# Patient Record
Sex: Male | Born: 1965 | Race: White | Hispanic: No | Smoking: Current some day smoker
Health system: Southern US, Community
[De-identification: ages and names within clinical notes are randomized; demographics above are authoritative.]

## PROBLEM LIST (undated history)

## (undated) DIAGNOSIS — M7550 Bursitis of unspecified shoulder: Secondary | ICD-10-CM

## (undated) DIAGNOSIS — M1990 Unspecified osteoarthritis, unspecified site: Secondary | ICD-10-CM

## (undated) DIAGNOSIS — K219 Gastro-esophageal reflux disease without esophagitis: Secondary | ICD-10-CM

## (undated) DIAGNOSIS — J302 Other seasonal allergic rhinitis: Secondary | ICD-10-CM

## (undated) HISTORY — DX: Gastro-esophageal reflux disease without esophagitis: K21.9

## (undated) HISTORY — PX: SMALL INTESTINE SURGERY: SHX150

## (undated) HISTORY — DX: Bursitis of unspecified shoulder: M75.50

## (undated) HISTORY — DX: Unspecified osteoarthritis, unspecified site: M19.90

## (undated) HISTORY — DX: Other seasonal allergic rhinitis: J30.2

---
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
## 2008-03-23 ENCOUNTER — Ambulatory Visit: Payer: Self-pay | Admitting: *Deleted

## 2008-03-23 DIAGNOSIS — J309 Allergic rhinitis, unspecified: Secondary | ICD-10-CM | POA: Insufficient documentation

## 2008-03-23 DIAGNOSIS — K219 Gastro-esophageal reflux disease without esophagitis: Secondary | ICD-10-CM

## 2008-03-23 DIAGNOSIS — M25519 Pain in unspecified shoulder: Secondary | ICD-10-CM

## 2008-03-24 ENCOUNTER — Telehealth (INDEPENDENT_AMBULATORY_CARE_PROVIDER_SITE_OTHER): Payer: Self-pay | Admitting: *Deleted

## 2008-03-24 ENCOUNTER — Encounter: Payer: Self-pay | Admitting: Family Medicine

## 2008-03-24 ENCOUNTER — Encounter (INDEPENDENT_AMBULATORY_CARE_PROVIDER_SITE_OTHER): Payer: Self-pay | Admitting: *Deleted

## 2008-03-24 LAB — CONVERTED CEMR LAB
HCV Ab: NEGATIVE
Hep A IgM: NEGATIVE
Hep B C IgM: NEGATIVE
Hepatitis B Surface Ag: NEGATIVE

## 2008-03-25 ENCOUNTER — Encounter: Payer: Self-pay | Admitting: *Deleted

## 2008-04-01 ENCOUNTER — Ambulatory Visit: Payer: Self-pay | Admitting: *Deleted

## 2008-04-01 DIAGNOSIS — E7800 Pure hypercholesterolemia, unspecified: Secondary | ICD-10-CM

## 2008-12-31 ENCOUNTER — Ambulatory Visit: Payer: Self-pay | Admitting: Family Medicine

## 2009-06-08 ENCOUNTER — Telehealth (INDEPENDENT_AMBULATORY_CARE_PROVIDER_SITE_OTHER): Payer: Self-pay | Admitting: *Deleted

## 2009-06-17 ENCOUNTER — Ambulatory Visit: Payer: Self-pay | Admitting: Family Medicine

## 2009-08-19 ENCOUNTER — Encounter: Payer: Self-pay | Admitting: Internal Medicine

## 2009-12-15 ENCOUNTER — Ambulatory Visit: Payer: Self-pay | Admitting: Family Medicine

## 2009-12-15 DIAGNOSIS — B379 Candidiasis, unspecified: Secondary | ICD-10-CM | POA: Insufficient documentation

## 2009-12-21 ENCOUNTER — Telehealth (INDEPENDENT_AMBULATORY_CARE_PROVIDER_SITE_OTHER): Payer: Self-pay | Admitting: *Deleted

## 2009-12-29 ENCOUNTER — Ambulatory Visit: Payer: Self-pay | Admitting: Family Medicine

## 2009-12-29 DIAGNOSIS — L0231 Cutaneous abscess of buttock: Secondary | ICD-10-CM

## 2009-12-29 DIAGNOSIS — L03317 Cellulitis of buttock: Secondary | ICD-10-CM | POA: Insufficient documentation

## 2010-01-12 ENCOUNTER — Ambulatory Visit: Payer: Self-pay | Admitting: Family Medicine

## 2010-01-12 DIAGNOSIS — R21 Rash and other nonspecific skin eruption: Secondary | ICD-10-CM

## 2010-01-13 LAB — CONVERTED CEMR LAB
ALT: 21 units/L (ref 0–53)
Albumin: 4.6 g/dL (ref 3.5–5.2)
Alkaline Phosphatase: 48 units/L (ref 39–117)
Basophils Relative: 0.9 % (ref 0.0–3.0)
Bilirubin, Direct: 0.1 mg/dL (ref 0.0–0.3)
CO2: 31 meq/L (ref 19–32)
Chloride: 103 meq/L (ref 96–112)
Cholesterol: 202 mg/dL — ABNORMAL HIGH (ref 0–200)
Direct LDL: 124.3 mg/dL
Eosinophils Relative: 3.2 % (ref 0.0–5.0)
GFR calc non Af Amer: 86.33 mL/min (ref >60)
Hemoglobin: 15.8 g/dL (ref 13.0–17.0)
MCV: 91.2 fL (ref 78.0–100.0)
Monocytes Absolute: 0.4 10*3/uL (ref 0.1–1.0)
Neutro Abs: 2.8 10*3/uL (ref 1.4–7.7)
Neutrophils Relative %: 59.3 % (ref 43.0–77.0)
Potassium: 4.9 meq/L (ref 3.5–5.1)
RBC: 5 M/uL (ref 4.22–5.81)
Sodium: 141 meq/L (ref 135–145)
Total CHOL/HDL Ratio: 3
Total Protein: 7.7 g/dL (ref 6.0–8.3)
VLDL: 11.8 mg/dL (ref 0.0–40.0)
WBC: 4.8 10*3/uL (ref 4.5–10.5)

## 2010-01-25 ENCOUNTER — Encounter: Payer: Self-pay | Admitting: Family Medicine

## 2010-10-08 LAB — CONVERTED CEMR LAB
ALT: 28 units/L (ref 0–53)
AST: 18 units/L (ref 0–37)
Alkaline Phosphatase: 45 units/L (ref 39–117)
CO2: 28 meq/L (ref 19–32)
Cholesterol: 206 mg/dL (ref 0–200)
Creatinine, Ser: 0.9 mg/dL (ref 0.4–1.5)
Sodium: 137 meq/L (ref 135–145)
Total Bilirubin: 1 mg/dL (ref 0.3–1.2)
Total CHOL/HDL Ratio: 4.3
Total Protein: 7.6 g/dL (ref 6.0–8.3)
Triglycerides: 106 mg/dL (ref 0–149)
VLDL: 21 mg/dL (ref 0–40)
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
## 2010-10-11 NOTE — Consult Note (Signed)
Summary: [facility]   Imported By: Lanelle Bal 02/07/2010 13:14:12  _____________________________________________________________________  External Attachment:    Type:   Image     Comment:   External Document

## 2010-10-11 NOTE — Assessment & Plan Note (Signed)
Summary: personal issues//lch   Vital Signs:  Patient profile:   45 year old male Height:      69 inches Weight:      178 pounds BMI:     26.38 Pulse rate:   78 / minute BP sitting:   120 / 70  (left arm)  Vitals Entered By: Doristine Devoid (December 15, 2009 2:45 PM) CC: rash and burning at anal area x3 weeks   History of Present Illness: 45 yo man here today w/ anal rash and burning/itching.  sxs first started 3 weeks ago.  has been using hydrocortisone, neosporin, diaper rash cream.  sxs worsen w/ bowel movements.  some blood due to scratching.  itching is external.  no new sex partners, hot tub exposure.  rash has not spread- remains localized.  no hx of similar.  Allergies (verified): No Known Drug Allergies  Review of Systems      See HPI  Physical Exam  General:  Well-developed,well-nourished,in no acute distress; alert,appropriate and cooperative throughout examination Skin:  large erythematous area w/ sattelite lesions on L internal buttock.   Impression & Recommendations:  Problem # 1:  YEAST INFECTION (ICD-112.9) Assessment New skin sxs consistent w/ yeast.  start miconazole two times a day.  Pt expresses understanding and is in agreement w/ this plan.  Complete Medication List: 1)  Omeprazole 40 Mg Cpdr (Omeprazole) .Marland Kitchen.. 1 by mouth once daily 2)  Nasonex 50 Mcg/act Susp (Mometasone furoate) .... 2 sprays each nostril once daily 3)  Cheratussin Ac 100-10 Mg/34ml Syrp (Guaifenesin-codeine) .Marland Kitchen.. 1-2 tsps q4-6 as needed for cough.  will cause drowsiness. disp .  Patient Instructions: 1)  Please schedule a follow-up appointment as needed. 2)  This is yeast- apply miconazole two times a day 3)  Keep the area clean and dry 4)  Hang in there!

## 2010-10-11 NOTE — Assessment & Plan Note (Signed)
Summary: "same as last visit"//lch   Vital Signs:  Patient profile:   45 year old male Weight:      178 pounds BP sitting:   110 / 60  (left arm)  Vitals Entered By: Doristine Devoid (December 29, 2009 3:59 PM) CC: rash no better    History of Present Illness: 45 yo man here today for persistant rash.  continued irritation and raw feeling despite miconazole and oral fluconazole.  pt feels sxs are worse.  pt not sweating excessively.  sxs are now interfering w/ sleep.  pt is scratching area while asleep.  felt sxs initially improved and then worsened again.  Allergies (verified): No Known Drug Allergies  Review of Systems      See HPI  Physical Exam  General:  Well-developed,well-nourished,in no acute distress; alert,appropriate and cooperative throughout examination Skin:  area on L buttock now erythematous w/ skin sloughing and some blistering.  + excoriations   Impression & Recommendations:  Problem # 1:  YEAST INFECTION (ICD-112.9) Assessment Deteriorated area that was initially yeast now appears secondarily infected.  reviewed the importance of avoiding scratching which is likely what led to infxn.  start abx and steroid crema.  reviewed supportive care.  derm referral for persistant irritation leading to the scratching.  Pt expresses understanding and is in agreement w/ this plan. Orders: Dermatology Referral (Derma)  Problem # 2:  CELLULITIS, BUTTOCKS (ICD-682.5) Assessment: New see above His updated medication list for this problem includes:    Cephalexin 500 Mg Tabs (Cephalexin) .Marland Kitchen... Take one by mouth three times a day x7 days  Complete Medication List: 1)  Omeprazole 40 Mg Cpdr (Omeprazole) .Marland Kitchen.. 1 by mouth once daily 2)  Nasonex 50 Mcg/act Susp (Mometasone furoate) .... 2 sprays each nostril once daily 3)  Cheratussin Ac 100-10 Mg/19ml Syrp (Guaifenesin-codeine) .Marland Kitchen.. 1-2 tsps q4-6 as needed for cough.  will cause drowsiness. disp . 4)  Fluconazole 100 Mg Tabs  (Fluconazole) .... Take one tablet daily x10 days 5)  Triamcinolone Acetonide 0.1 % Oint (Triamcinolone acetonide) .... Apply to affected area two times a day.  disp 1 large tube 6)  Cephalexin 500 Mg Tabs (Cephalexin) .... Take one by mouth three times a day x7 days  Patient Instructions: 1)  Someone will call you with your Derm referral 2)  Use the steroid cream as directed 3)  Take the cephalexin three times a day as directed 4)  Call with any questions or concerns! 5)  HANG IN THERE!!! Prescriptions: CEPHALEXIN 500 MG  TABS (CEPHALEXIN) take one by mouth three times a day x7 days  #21 x 0   Entered and Authorized by:   Neena Rhymes MD   Signed by:   Neena Rhymes MD on 12/29/2009   Method used:   Electronically to        [facility] (retail)       [address]       [place], [place]  [address]       Ph: [phone]       Fax: [phone]   RxID:   [id number] TRIAMCINOLONE ACETONIDE 0.1 % OINT (TRIAMCINOLONE ACETONIDE) apply to affected area two times a day.  disp 1 large tube  #1 x 1   Entered and Authorized by:   Neena Rhymes MD   Signed by:   Neena Rhymes MD on 12/29/2009   Method used:   Electronically to  [facility] (retail)       [address]       [place], [place]  [address]       Ph: [phone]       Fax: [phone]   RxID:   [id number]

## 2010-10-11 NOTE — Progress Notes (Signed)
Summary: yeast infection no better   Phone Note Call from Patient Call back at [phone]   Caller: Patient Summary of Call: Patient left msg on voicemail was seen 12/15/09 says that otc medication recommended isn't helping w/ yeast infection and wanted to know if something stronger can be called into pharmacy. He is still having some burning and discomfort.....Marland KitchenMarland KitchenDoristine Devoid  December 21, 2009 4:36 PM   Follow-up for Phone Call        it has only been a week- it will take longer than that to completely resolve.  as long as it's improving that's a good sign.  if no improvement we can start Fluconazole 100mg  daily x10 days.  he should not drink alcohol while on this medicine Follow-up by: Neena Rhymes MD,  December 22, 2009 8:54 AM  Additional Follow-up for Phone Call Additional follow up Details #1::        spoke w/ patient says he isn't seeing a whole lot of improvement although it's hard to tell informed that we will call in oral antifungal med and aware of instructions .....Marland KitchenMarland KitchenDoristine Devoid  December 22, 2009 10:16 AM     New/Updated Medications: FLUCONAZOLE 100 MG TABS (FLUCONAZOLE) take one tablet daily x10 days Prescriptions: FLUCONAZOLE 100 MG TABS (FLUCONAZOLE) take one tablet daily x10 days  #10 x 0   Entered by:   Doristine Devoid   Authorized by:   Neena Rhymes MD   Signed by:   Doristine Devoid on 12/22/2009   Method used:   Electronically to        [facility] (retail)       [address]       [place], [place]  [address]       Ph: [phone]       Fax: [phone]   RxID:   [id number]

## 2010-11-10 ENCOUNTER — Ambulatory Visit (INDEPENDENT_AMBULATORY_CARE_PROVIDER_SITE_OTHER): Payer: PRIVATE HEALTH INSURANCE | Admitting: Internal Medicine

## 2010-11-10 ENCOUNTER — Encounter: Payer: Self-pay | Admitting: Internal Medicine

## 2010-11-10 DIAGNOSIS — J209 Acute bronchitis, unspecified: Secondary | ICD-10-CM

## 2010-11-16 NOTE — Assessment & Plan Note (Signed)
Summary: cold and cough since Tuesday///sph   Vital Signs:  Patient profile:   45 year old male Weight:      184.8 pounds BMI:     27.39 Temp:     98.3 degrees F oral Pulse rate:   76 / minute Resp:     14 per minute BP sitting:   120 / 84  (left arm) Cuff size:   regular  Vitals Entered By: Shonna Chock CMA (November 10, 2010 2:59 PM) CC: Cough and cold since Tuesday, URI symptoms   Primary Care Provider:  Neena Rhymes MD  CC:  Cough and cold since Tuesday and URI symptoms.  History of Present Illness:    Onset 02/28/20112 as tickle in throat & malaise; he now reports dry cough, but denies nasal congestion, purulent nasal discharge, and earache.  Associated symptoms include fever of 100.5 degrees.  The patient denies dyspnea and wheezing.  The patient denies frontal  headache,bilateral facial pain, tooth pain, and tender adenopathy.  Rx: Alka Seltzer Cold Plus  Current Medications (verified): 1)  Omeprazole 40 Mg Cpdr (Omeprazole) .Marland Kitchen.. 1 By Mouth Once Daily  Allergies (verified): No Known Drug Allergies  Physical Exam  General:   Appears tired;in no acute distress; alert,appropriate and cooperative throughout examination Ears:  External ear exam shows no significant lesions or deformities.  Otoscopic examination reveals clear canals, tympanic membranes are intact bilaterally without bulging, retraction, inflammation or discharge. Hearing is grossly normal bilaterally. TMs scarred Nose:  External nasal examination shows no deformity or inflammation. Nasal mucosa are  dry  without lesions or exudates. Mouth:  Oral mucosa and oropharynx without lesions or exudates.  Teeth in good repair. Lungs:  Normal respiratory effort, chest expands symmetrically. Lungs are clear to auscultation, no crackles or wheezes. Dry cough Cervical Nodes:  No lymphadenopathy noted Axillary Nodes:  No palpable lymphadenopathy   Impression & Recommendations:  Problem # 1:  BRONCHITIS-ACUTE  (ICD-466.0)  His updated medication list for this problem includes:    Azithromycin 250 Mg Tabs (Azithromycin) .Marland Kitchen... As per pack    Hydromet 5-1.5 Mg/108ml Syrp (Hydrocodone-homatropine) .Marland Kitchen... 1 tsp every 6 hrs as needed  Complete Medication List: 1)  Omeprazole 40 Mg Cpdr (Omeprazole) .Marland Kitchen.. 1 by mouth once daily 2)  Azithromycin 250 Mg Tabs (Azithromycin) .... As per pack 3)  Hydromet 5-1.5 Mg/38ml Syrp (Hydrocodone-homatropine) .Marland Kitchen.. 1 tsp every 6 hrs as needed  Patient Instructions: 1)  Drink as much  NON dairy fluid as you can tolerate for the next few days. Prescriptions: HYDROMET 5-1.5 MG/5ML SYRP (HYDROCODONE-HOMATROPINE) 1 tsp every 6 hrs as needed  #120cc x 0   Entered and Authorized by:   Marga Melnick MD   Signed by:   Marga Melnick MD on 11/10/2010   Method used:   Printed then faxed to ...       [facility] (retail)       [address]       [place], [place]  [address]       Ph: [phone]       Fax: [phone]   RxID:   [id number] AZITHROMYCIN 250 MG TABS (AZITHROMYCIN) as per pack  #1 x 0   Entered and Authorized by:   Marga Melnick MD   Signed by:   Marga Melnick MD on 11/10/2010   Method used:   Printed then faxed to ...       [facility] (retail)  [address]       [place], [place]  [address]       Ph: [phone]       Fax: [phone]   RxID:   [id number]    Orders Added: 1)  Est. Patient Level III [[address]]

## 2010-11-17 ENCOUNTER — Telehealth: Payer: Self-pay | Admitting: Internal Medicine

## 2010-11-21 NOTE — Progress Notes (Signed)
Summary: stronger cough med -lmom3/9  Phone Note Call from Patient   Caller: Patient Summary of Call: Pt called left msg on voicemail would like stronger cough med.  Follow-up for Phone Call        left message on machine .Marland KitchenMarland KitchenMarland KitchenDoristine Devoid CMA  November 17, 2010 9:56 AM   Spoke w/ patient says he has tried otc cough medication in addition to medication that was given informed patient that most prescription cough med contain codiene nothing much but will ask Dr. Jearld Adjutant if any other suggestions....Marland KitchenMarland KitchenDoristine Devoid CMA  November 17, 2010 2:52 PM    Additional Follow-up for Phone Call Additional follow up Details #1::        this is a narcotic cough med; Prednisone 20 mg two times a day with meals #10  can be added but this is strongest Rx cough prep Additional Follow-up by: Marga Melnick MD,  November 17, 2010 4:48 PM    Additional Follow-up for Phone Call Additional follow up Details #2::    left detailed msg on machine....Marland KitchenMarland KitchenDoristine Devoid CMA  November 18, 2010 8:25 AM   New/Updated Medications: PREDNISONE 20 MG TABS (PREDNISONE) take two times a day Prescriptions: PREDNISONE 20 MG TABS (PREDNISONE) take two times a day  #10 x 0   Entered by:   Doristine Devoid CMA   Authorized by:   Marga Melnick MD   Signed by:   Doristine Devoid CMA on 11/18/2010   Method used:   Electronically to        [facility] (retail)       [address]       [place], [place]  [address]       Ph: [phone]       Fax: [phone]   RxID:   [id number]

## 2011-03-30 ENCOUNTER — Encounter: Payer: Self-pay | Admitting: Family Medicine

## 2011-05-03 ENCOUNTER — Encounter: Payer: Self-pay | Admitting: Family Medicine

## 2011-05-03 ENCOUNTER — Ambulatory Visit (INDEPENDENT_AMBULATORY_CARE_PROVIDER_SITE_OTHER): Payer: PRIVATE HEALTH INSURANCE | Admitting: Family Medicine

## 2011-05-03 DIAGNOSIS — J40 Bronchitis, not specified as acute or chronic: Secondary | ICD-10-CM

## 2011-05-03 DIAGNOSIS — J069 Acute upper respiratory infection, unspecified: Secondary | ICD-10-CM

## 2011-05-03 MED ORDER — MOMETASONE FUROATE 50 MCG/ACT NA SUSP: 2.0000 | Freq: Every day | NASAL | Status: AC

## 2011-05-03 MED ORDER — PSEUDOEPH-CHLORPHEN-HYDROCOD 60-4-5 MG/5ML PO SOLN: 5.0000 mL | ORAL | Status: AC | PRN

## 2011-05-03 MED ORDER — CEFUROXIME AXETIL 500 MG PO TABS: 500.0000 mg | ORAL_TABLET | Freq: Two times a day (BID) | ORAL | Status: DC

## 2011-05-03 NOTE — Patient Instructions (Signed)
Common Cold, Adult An upper respiratory tract infection, or cold, is a viral infection of the air passages to the lung. Colds are contagious, especially during the first 3 or 4 days. Antibiotics cannot cure a cold. Cold germs are spread by coughs, sneezes, and hand to hand contact. A respiratory tract infection usually clears up in a few days, but some people may be sick for a week or two. HOME CARE INSTRUCTIONS  Only take over-the-counter or prescription medicines for pain, discomfort, or fever as directed by your caregiver.   Be careful not to blow your nose too hard. This may cause a nosebleed.   Use a cool-mist humidifier (vaporizer) to increase air moisture. This will make it easier for you to breath. Do not use hot steam.   Rest as much as possible and get plenty of sleep.   Wash your hands often, especially after you blow your nose. Cover your mouth and nose with a tissue when you sneeze or cough.   Drink at least 8 glasses of clear liquids every day, such as water, fruit juices, tea, clear soups, and carbonated beverages.  SEEK MEDICAL CARE IF:  An oral temperature above 100.4 lasts 4 days or more, and is not controlled by medication.   You have a sore throat that gets worse or you see white or yellow spots in your throat.   Your cough gets worse or lasts more than 10 days.   You have a rash somewhere on your skin. You have large and tender lumps in your neck.   You have an earache or a headache.   You have thick, greenish or yellowish discharge from your nose.   You cough-up thick yellow, green, gray or bloody mucus (secretions).  SEEK IMMEDIATE MEDICAL CARE IF: You have trouble breathing, chest pain, or your skin or nails look gray or blue. MAKE SURE YOU:   Understand these instructions.   Will watch your condition.   Will get help right away if you are not doing well or get worse.  Document Released: 08/24/2000 Document Re-Released: 08/09/2008 [facility] Patient  Information 2011 [place], [place].

## 2011-05-03 NOTE — Progress Notes (Signed)
  Subjective:     Jeffrey Ellison is a 45 y.o. male who presents for evaluation of symptoms of a URI. Symptoms include congestion, no  fever, post nasal drip and productive cough with  clear colored sputum. Onset of symptoms was 5 days ago, and has been gradually worsening since that time. Treatment to date: antihistamines and cough suppressants.  The following portions of the patient's history were reviewed and updated as appropriate: allergies, current medications, past family history, past medical history, past social history, past surgical history and problem list.  Review of Systems Pertinent items are noted in HPI.   Objective:    BP 114/76  Pulse 96  Temp(Src) 99 F (37.2 C) (Oral)  Wt 188 lb 12.8 oz (85.639 kg)  SpO2 99% General appearance: alert, cooperative, appears stated age and no distress Head: Normocephalic, without obvious abnormality, atraumatic Ears: normal TM's and external ear canals both ears Nose: Nares normal. Septum midline. Mucosa normal. No drainage or sinus tenderness., turbinates red, swollen, clear drainage Throat: abnormal findings: mild oropharyngeal erythema and PND Neck: no adenopathy and supple, symmetrical, trachea midline Lungs: rhonchi LLL Heart: S1, S2 normal Skin: Skin color, texture, turgor normal. No rashes or lesions Lymph nodes: Cervical, supraclavicular, and axillary nodes normal.   Assessment:    bronchitis   Plan:    Suggested symptomatic OTC remedies. Nasal steroids per orders. Follow up as needed. Call in several days if symptoms aren't resolving.

## 2011-05-04 ENCOUNTER — Telehealth: Payer: Self-pay | Admitting: *Deleted

## 2011-05-04 DIAGNOSIS — J40 Bronchitis, not specified as acute or chronic: Secondary | ICD-10-CM

## 2011-05-04 MED ORDER — HYDROCOD POLST-CHLORPHEN POLST 10-8 MG/5ML PO LQCR: 5.0000 mL | Freq: Every evening | ORAL | Status: DC | PRN

## 2011-05-04 MED ORDER — CEFUROXIME AXETIL 500 MG PO TABS: 500.0000 mg | ORAL_TABLET | Freq: Two times a day (BID) | ORAL | Status: AC

## 2011-05-04 NOTE — Telephone Encounter (Signed)
Discussed with patient and he stated he threw the ABX away. I advised I would refax along with cough medication--he voiced understanding    KP

## 2011-05-04 NOTE — Telephone Encounter (Signed)
I gave him a rx for abx  tussonex  6 oz  1 tsp po qhs prn ---only use at night Use mucinex or mucinex dm during day

## 2011-05-04 NOTE — Telephone Encounter (Signed)
Pt states that sample cough med work some but would like to have a stronger cough med sent to pharmacy. Pt also notes that he is now coughing up greenish mucous and would like to have a antibiotic.Please advise   [facility]

## 2011-05-22 ENCOUNTER — Telehealth: Payer: Self-pay

## 2011-05-22 MED ORDER — HYDROCOD POLST-CHLORPHEN POLST 10-8 MG/5ML PO LQCR: 5.0000 mL | Freq: Every evening | ORAL | Status: AC | PRN

## 2011-05-22 NOTE — Telephone Encounter (Signed)
Ok to refill x1---will need ov if symptoms no better after that

## 2011-05-22 NOTE — Telephone Encounter (Signed)
Left Pt detail message. 

## 2011-05-22 NOTE — Telephone Encounter (Signed)
Pt c/o continued cough at night and would like to know if he can get another Rx for the cough. Please advise

## 2012-04-18 ENCOUNTER — Encounter: Payer: Self-pay | Admitting: Family Medicine

## 2012-04-18 ENCOUNTER — Ambulatory Visit (INDEPENDENT_AMBULATORY_CARE_PROVIDER_SITE_OTHER): Payer: PRIVATE HEALTH INSURANCE | Admitting: Family Medicine

## 2012-04-18 VITALS — BP 117/77 | HR 90 | Temp 98.0°F | Ht 68.0 in | Wt 179.4 lb

## 2012-04-18 DIAGNOSIS — Z0000 Encounter for general adult medical examination without abnormal findings: Secondary | ICD-10-CM

## 2012-04-18 LAB — CBC WITH DIFFERENTIAL/PLATELET
Basophils Absolute: 0 10*3/uL (ref 0.0–0.1)
Eosinophils Absolute: 0.1 10*3/uL (ref 0.0–0.7)
Hemoglobin: 14.6 g/dL (ref 13.0–17.0)
Lymphocytes Relative: 25.4 % (ref 12.0–46.0)
MCHC: 33.4 g/dL (ref 30.0–36.0)
MCV: 92 fl (ref 78.0–100.0)
Monocytes Absolute: 0.5 10*3/uL (ref 0.1–1.0)
Neutro Abs: 3.9 10*3/uL (ref 1.4–7.7)
Neutrophils Relative %: 64.4 % (ref 43.0–77.0)
RDW: 13.1 % (ref 11.5–14.6)

## 2012-04-18 LAB — BASIC METABOLIC PANEL
BUN: 16 mg/dL (ref 6–23)
Chloride: 105 mEq/L (ref 96–112)
Creatinine, Ser: 1 mg/dL (ref 0.4–1.5)
GFR: 87.4 mL/min (ref >60.00)

## 2012-04-18 LAB — HEPATIC FUNCTION PANEL
ALT: 23 U/L (ref 0–53)
Bilirubin, Direct: 0.1 mg/dL (ref 0.0–0.3)
Total Bilirubin: 0.6 mg/dL (ref 0.3–1.2)

## 2012-04-18 LAB — LIPID PANEL
Cholesterol: 158 mg/dL (ref 0–200)
HDL: 56.7 mg/dL (ref >39.00)
LDL Cholesterol: 89 mg/dL (ref 0–99)
Triglycerides: 63 mg/dL (ref 0.0–149.0)
VLDL: 12.6 mg/dL (ref 0.0–40.0)

## 2012-04-18 LAB — TSH: TSH: 1.36 u[IU]/mL (ref 0.35–5.50)

## 2012-04-18 MED ORDER — TETANUS-DIPHTH-ACELL PERTUSSIS 5-2.5-18.5 LF-MCG/0.5 IM SUSP
0.5000 mL | Freq: Once | INTRAMUSCULAR | Status: AC
  Administered 2012-04-18: 0.5 mL via INTRAMUSCULAR

## 2012-04-18 NOTE — Progress Notes (Signed)
  Subjective:    Patient ID: Jeffrey Ellison, male    DOB: 1966-07-01, 46 y.o.   MRN: [id number]  HPI CPE- no concerns today.  Based on family hx will get PSA and start colonoscopy at age 89   Review of Systems Patient reports no vision/hearing changes, anorexia, fever ,adenopathy, persistant/recurrent hoarseness, swallowing issues, chest pain, palpitations, edema, persistant/recurrent cough, hemoptysis, dyspnea (rest,exertional, paroxysmal nocturnal), gastrointestinal  bleeding (melena, rectal bleeding), abdominal pain, excessive heart burn, GU symptoms (dysuria, hematuria, voiding/incontinence issues) syncope, focal weakness, memory loss, numbness & tingling, skin/hair/nail changes, depression, anxiety, abnormal bruising/bleeding, musculoskeletal symptoms/signs.     Objective:   Physical Exam BP 117/77  Pulse 90  Temp 98 F (36.7 C) (Oral)  Ht 5\' 8"  (1.727 m)  Wt 179 lb 6.4 oz (81.375 kg)  BMI 27.28 kg/m2  SpO2 99%  General Appearance:    Alert, cooperative, no distress, appears stated age  Head:    Normocephalic, without obvious abnormality, atraumatic  Eyes:    PERRL, conjunctiva/corneas clear, EOM's intact, fundi    benign, both eyes       Ears:    Normal TM's and external ear canals, both ears  Nose:   Nares normal, septum midline, mucosa normal, no drainage   or sinus tenderness  Throat:   Lips, mucosa, and tongue normal; teeth and gums normal  Neck:   Supple, symmetrical, trachea midline, no adenopathy;       thyroid:  No enlargement/tenderness/nodules; no carotid   bruit or JVD  Back:     Symmetric, no curvature, ROM normal, no CVA tenderness  Lungs:     Clear to auscultation bilaterally, respirations unlabored  Chest wall:    No tenderness or deformity  Heart:    Regular rate and rhythm, S1 and S2 normal, no murmur, rub   or gallop  Abdomen:     Soft, non-tender, bowel sounds active all four quadrants,    no masses, no organomegaly  Genitalia:    Normal male without  lesion, discharge or tenderness  Rectal:    Deferred due to young age  Extremities:   Extremities normal, atraumatic, no cyanosis or edema  Pulses:   2+ and symmetric all extremities  Skin:   Skin color, texture, turgor normal, no rashes or lesions  Lymph nodes:   Cervical, supraclavicular, and axillary nodes normal  Neurologic:   CNII-XII intact. Normal strength, sensation and reflexes      throughout          Assessment & Plan:

## 2012-04-18 NOTE — Patient Instructions (Addendum)
Follow up in 1 year- sooner if needed We'll notify you of your lab results Call with any questions or concerns Keep up the good work! Have a great weekend!

## 2012-04-18 NOTE — Assessment & Plan Note (Signed)
Pt's exam WNL.  Check labs.  EKG done- see document for interpretation.  Anticipatory guidance provided.

## 2012-10-25 ENCOUNTER — Other Ambulatory Visit: Payer: Self-pay

## 2013-07-16 ENCOUNTER — Other Ambulatory Visit: Payer: Self-pay

## 2019-09-14 ENCOUNTER — Encounter: Payer: Self-pay | Admitting: Family Medicine

## 2019-09-14 ENCOUNTER — Ambulatory Visit (INDEPENDENT_AMBULATORY_CARE_PROVIDER_SITE_OTHER): Payer: PRIVATE HEALTH INSURANCE | Admitting: Family Medicine

## 2019-09-14 ENCOUNTER — Other Ambulatory Visit: Payer: Self-pay

## 2019-09-14 VITALS — BP 130/82 | HR 104 | Temp 98.7°F | Ht 68.0 in | Wt 183.6 lb

## 2019-09-14 DIAGNOSIS — R000 Tachycardia, unspecified: Secondary | ICD-10-CM | POA: Diagnosis not present

## 2019-09-14 DIAGNOSIS — F439 Reaction to severe stress, unspecified: Secondary | ICD-10-CM

## 2019-09-14 DIAGNOSIS — Z23 Encounter for immunization: Secondary | ICD-10-CM

## 2019-09-14 DIAGNOSIS — Z0000 Encounter for general adult medical examination without abnormal findings: Secondary | ICD-10-CM

## 2019-09-14 LAB — LIPID PANEL
Cholesterol: 233 mg/dL — ABNORMAL HIGH (ref 0–200)
HDL: 58.1 mg/dL (ref >39.00)
LDL Cholesterol: 151 mg/dL — ABNORMAL HIGH (ref 0–99)
NonHDL: 174.52
Total CHOL/HDL Ratio: 4
Triglycerides: 119 mg/dL (ref 0.0–149.0)
VLDL: 23.8 mg/dL (ref 0.0–40.0)

## 2019-09-14 LAB — CBC
HCT: 47.8 % (ref 39.0–52.0)
Hemoglobin: 16.1 g/dL (ref 13.0–17.0)
MCHC: 33.7 g/dL (ref 30.0–36.0)
MCV: 93 fl (ref 78.0–100.0)
Platelets: 268 10*3/uL (ref 150.0–400.0)
RBC: 5.14 Mil/uL (ref 4.22–5.81)
RDW: 13 % (ref 11.5–15.5)
WBC: 5.4 10*3/uL (ref 4.0–10.5)

## 2019-09-14 LAB — URINALYSIS, ROUTINE W REFLEX MICROSCOPIC
Bilirubin Urine: NEGATIVE
Ketones, ur: NEGATIVE
Leukocytes,Ua: NEGATIVE
Nitrite: NEGATIVE
Specific Gravity, Urine: 1.025 (ref 1.000–1.030)
Total Protein, Urine: NEGATIVE
Urine Glucose: NEGATIVE
Urobilinogen, UA: 0.2 (ref 0.0–1.0)
pH: 6 (ref 5.0–8.0)

## 2019-09-14 LAB — COMPREHENSIVE METABOLIC PANEL
ALT: 32 U/L (ref 0–53)
AST: 17 U/L (ref 0–37)
Albumin: 4.6 g/dL (ref 3.5–5.2)
Alkaline Phosphatase: 51 U/L (ref 39–117)
BUN: 12 mg/dL (ref 6–23)
CO2: 27 mEq/L (ref 19–32)
Calcium: 9.5 mg/dL (ref 8.4–10.5)
Chloride: 100 mEq/L (ref 96–112)
Creatinine, Ser: 1.09 mg/dL (ref 0.40–1.50)
GFR: 71.29 mL/min (ref >60.00)
Glucose, Bld: 123 mg/dL — ABNORMAL HIGH (ref 70–99)
Potassium: 4.4 mEq/L (ref 3.5–5.1)
Sodium: 137 mEq/L (ref 135–145)
Total Bilirubin: 0.4 mg/dL (ref 0.2–1.2)
Total Protein: 7.2 g/dL (ref 6.0–8.3)

## 2019-09-14 LAB — TSH: TSH: 2.3 u[IU]/mL (ref 0.35–4.50)

## 2019-09-14 LAB — PSA: PSA: 3.08 ng/mL (ref 0.10–4.00)

## 2019-09-14 LAB — GAMMA GT: GGT: 68 U/L — ABNORMAL HIGH (ref 7–51)
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
## 2019-09-14 NOTE — Patient Instructions (Signed)
Preventive Care 41-52 Years Old, Male Preventive care refers to lifestyle choices and visits with your health care provider that can promote health and wellness. This includes:  A yearly physical exam. This is also called an annual well check.  Regular dental and eye exams.  Immunizations.  Screening for certain conditions.  Healthy lifestyle choices, such as eating a healthy diet, getting regular exercise, not using drugs or products that contain nicotine and tobacco, and limiting alcohol use. What can I expect for my preventive care visit? Physical exam Your health care provider will check:  Height and weight. These may be used to calculate body mass index (BMI), which is a measurement that tells if you are at a healthy weight.  Heart rate and blood pressure.  Your skin for abnormal spots. Counseling Your health care provider may ask you questions about:  Alcohol, tobacco, and drug use.  Emotional well-being.  Home and relationship well-being.  Sexual activity.  Eating habits.  Work and work Statistician. What immunizations do I need?  Influenza (flu) vaccine  This is recommended every year. Tetanus, diphtheria, and pertussis (Tdap) vaccine  You may need a Td booster every 10 years. Varicella (chickenpox) vaccine  You may need this vaccine if you have not already been vaccinated. Zoster (shingles) vaccine  You may need this after age 64. Measles, mumps, and rubella (MMR) vaccine  You may need at least one dose of MMR if you were born in 1957 or later. You may also need a second dose. Pneumococcal conjugate (PCV13) vaccine  You may need this if you have certain conditions and were not previously vaccinated. Pneumococcal polysaccharide (PPSV23) vaccine  You may need one or two doses if you smoke cigarettes or if you have certain conditions. Meningococcal conjugate (MenACWY) vaccine  You may need this if you have certain conditions. Hepatitis A  vaccine  You may need this if you have certain conditions or if you travel or work in places where you may be exposed to hepatitis A. Hepatitis B vaccine  You may need this if you have certain conditions or if you travel or work in places where you may be exposed to hepatitis B. Haemophilus influenzae type b (Hib) vaccine  You may need this if you have certain risk factors. Human papillomavirus (HPV) vaccine  If recommended by your health care provider, you may need three doses over 6 months. You may receive vaccines as individual doses or as more than one vaccine together in one shot (combination vaccines). Talk with your health care provider about the risks and benefits of combination vaccines. What tests do I need? Blood tests  Lipid and cholesterol levels. These may be checked every 5 years, or more frequently if you are over 60 years old.  Hepatitis C test.  Hepatitis B test. Screening  Lung cancer screening. You may have this screening every year starting at age 43 if you have a 30-pack-year history of smoking and currently smoke or have quit within the past 15 years.  Prostate cancer screening. Recommendations will vary depending on your family history and other risks.  Colorectal cancer screening. All adults should have this screening starting at age 72 and continuing until age 2. Your health care provider may recommend screening at age 14 if you are at increased risk. You will have tests every 1-10 years, depending on your results and the type of screening test.  Diabetes screening. This is done by checking your blood sugar (glucose) after you have not eaten  for a while (fasting). You may have this done every 1-3 years.  Sexually transmitted disease (STD) testing. Follow these instructions at home: Eating and drinking  Eat a diet that includes fresh fruits and vegetables, whole grains, lean protein, and low-fat dairy products.  Take vitamin and mineral supplements as  recommended by your health care provider.  Do not drink alcohol if your health care provider tells you not to drink.  If you drink alcohol: ? Limit how much you have to 0-2 drinks a day. ? Be aware of how much alcohol is in your drink. In the [place] one drink equals one 12 oz bottle of beer (355 mL), one 5 oz glass of wine (148 mL), or one 1 oz glass of hard liquor (44 mL). Lifestyle  Take daily care of your teeth and gums.  Stay active. Exercise for at least 30 minutes on 5 or more days each week.  Do not use any products that contain nicotine or tobacco, such as cigarettes, e-cigarettes, and chewing tobacco. If you need help quitting, ask your health care provider.  If you are sexually active, practice safe sex. Use a condom or other form of protection to prevent STIs (sexually transmitted infections).  Talk with your health care provider about taking a low-dose aspirin every day starting at age 50. What's next?  Go to your health care provider once a year for a well check visit.  Ask your health care provider how often you should have your eyes and teeth checked.  Stay up to date on all vaccines. This information is not intended to replace advice given to you by your health care provider. Make sure you discuss any questions you have with your health care provider. Document Revised: 08/21/2018 Document Reviewed: 08/21/2018 Elsevier Patient Education  2020 [employer]  Health Maintenance, Male Adopting a healthy lifestyle and getting preventive care are important in promoting health and wellness. Ask your health care provider about:  The right schedule for you to have regular tests and exams.  Things you can do on your own to prevent diseases and keep yourself healthy. What should I know about diet, weight, and exercise? Eat a healthy diet   Eat a diet that includes plenty of vegetables, fruits, low-fat dairy products, and lean protein.  Do not eat a lot of foods that are  high in solid fats, added sugars, or sodium. Maintain a healthy weight Body mass index (BMI) is a measurement that can be used to identify possible weight problems. It estimates body fat based on height and weight. Your health care provider can help determine your BMI and help you achieve or maintain a healthy weight. Get regular exercise Get regular exercise. This is one of the most important things you can do for your health. Most adults should:  Exercise for at least 150 minutes each week. The exercise should increase your heart rate and make you sweat (moderate-intensity exercise).  Do strengthening exercises at least twice a week. This is in addition to the moderate-intensity exercise.  Spend less time sitting. Even light physical activity can be beneficial. Watch cholesterol and blood lipids Have your blood tested for lipids and cholesterol at 52 years of age, then have this test every 5 years. You may need to have your cholesterol levels checked more often if:  Your lipid or cholesterol levels are high.  You are older than 52 years of age.  You are at high risk for heart disease. What should I know about   cancer screening? Many types of cancers can be detected early and may often be prevented. Depending on your health history and family history, you may need to have cancer screening at various ages. This may include screening for:  Colorectal cancer.  Prostate cancer.  Skin cancer.  Lung cancer. What should I know about heart disease, diabetes, and high blood pressure? Blood pressure and heart disease  High blood pressure causes heart disease and increases the risk of stroke. This is more likely to develop in people who have high blood pressure readings, are of African descent, or are overweight.  Talk with your health care provider about your target blood pressure readings.  Have your blood pressure checked: ? Every 3-5 years if you are 18-39 years of age. ? Every year  if you are 40 years old or older.  If you are between the ages of 65 and 75 and are a current or former smoker, ask your health care provider if you should have a one-time screening for abdominal aortic aneurysm (AAA). Diabetes Have regular diabetes screenings. This checks your fasting blood sugar level. Have the screening done:  Once every three years after age 45 if you are at a normal weight and have a low risk for diabetes.  More often and at a younger age if you are overweight or have a high risk for diabetes. What should I know about preventing infection? Hepatitis B If you have a higher risk for hepatitis B, you should be screened for this virus. Talk with your health care provider to find out if you are at risk for hepatitis B infection. Hepatitis C Blood testing is recommended for:  Everyone born from 1945 through 1965.  Anyone with known risk factors for hepatitis C. Sexually transmitted infections (STIs)  You should be screened each year for STIs, including gonorrhea and chlamydia, if: ? You are sexually active and are younger than 52 years of age. ? You are older than 52 years of age and your health care provider tells you that you are at risk for this type of infection. ? Your sexual activity has changed since you were last screened, and you are at increased risk for chlamydia or gonorrhea. Ask your health care provider if you are at risk.  Ask your health care provider about whether you are at high risk for HIV. Your health care provider may recommend a prescription medicine to help prevent HIV infection. If you choose to take medicine to prevent HIV, you should first get tested for HIV. You should then be tested every 3 months for as long as you are taking the medicine. Follow these instructions at home: Lifestyle  Do not use any products that contain nicotine or tobacco, such as cigarettes, e-cigarettes, and chewing tobacco. If you need help quitting, ask your health care  provider.  Do not use street drugs.  Do not share needles.  Ask your health care provider for help if you need support or information about quitting drugs. Alcohol use  Do not drink alcohol if your health care provider tells you not to drink.  If you drink alcohol: ? Limit how much you have to 0-2 drinks a day. ? Be aware of how much alcohol is in your drink. In the [place] one drink equals one 12 oz bottle of beer (355 mL), one 5 oz glass of wine (148 mL), or one 1 oz glass of hard liquor (44 mL). General instructions  Schedule regular health, dental, and eye exams.    Stay current with your vaccines.  Tell your health care provider if: ? You often feel depressed. ? You have ever been abused or do not feel safe at home. Summary  Adopting a healthy lifestyle and getting preventive care are important in promoting health and wellness.  Follow your health care provider's instructions about healthy diet, exercising, and getting tested or screened for diseases.  Follow your health care provider's instructions on monitoring your cholesterol and blood pressure. This information is not intended to replace advice given to you by your health care provider. Make sure you discuss any questions you have with your health care provider. Document Revised: 08/20/2018 Document Reviewed: 08/20/2018 Elsevier Patient Education  2020 [employer].  Exercising to Stay Healthy To become healthy and stay healthy, it is recommended that you do moderate-intensity and vigorous-intensity exercise. You can tell that you are exercising at a moderate intensity if your heart starts beating faster and you start breathing faster but can still hold a conversation. You can tell that you are exercising at a vigorous intensity if you are breathing much harder and faster and cannot hold a conversation while exercising. Exercising regularly is important. It has many health benefits, such as:  Improving overall fitness,  flexibility, and endurance.  Increasing bone density.  Helping with weight control.  Decreasing body fat.  Increasing muscle strength.  Reducing stress and tension.  Improving overall health. How often should I exercise? Choose an activity that you enjoy, and set realistic goals. Your health care provider can help you make an activity plan that works for you. Exercise regularly as told by your health care provider. This may include:  Doing strength training two times a week, such as: ? Lifting weights. ? Using resistance bands. ? Push-ups. ? Sit-ups. ? Yoga.  Doing a certain intensity of exercise for a given amount of time. Choose from these options: ? A total of 150 minutes of moderate-intensity exercise every week. ? A total of 75 minutes of vigorous-intensity exercise every week. ? A mix of moderate-intensity and vigorous-intensity exercise every week. Children, pregnant women, people who have not exercised regularly, people who are overweight, and older adults may need to talk with a health care provider about what activities are safe to do. If you have a medical condition, be sure to talk with your health care provider before you start a new exercise program. What are some exercise ideas? Moderate-intensity exercise ideas include:  Walking 1 mile (1.6 km) in about 15 minutes.  Biking.  Hiking.  Golfing.  Dancing.  Water aerobics. Vigorous-intensity exercise ideas include:  Walking 4.5 miles (7.2 km) or more in about 1 hour.  Jogging or running 5 miles (8 km) in about 1 hour.  Biking 10 miles (16.1 km) or more in about 1 hour.  Lap swimming.  Roller-skating or in-line skating.  Cross-country skiing.  Vigorous competitive sports, such as football, basketball, and soccer.  Jumping rope.  Aerobic dancing. What are some everyday activities that can help me to get exercise?  [place] work, such as: ? Pushing a Conservation officer, nature. ? Raking and bagging  leaves.  Washing your car.  Pushing a stroller.  Shoveling snow.  Gardening.  Washing windows or floors. How can I be more active in my day-to-day activities?  Use stairs instead of an elevator.  Take a walk during your lunch break.  If you drive, park your car farther away from your work or school.  If you take public transportation, get off one  stop early and walk the rest of the way.  Stand up or walk around during all of your indoor phone calls.  Get up, stretch, and walk around every 30 minutes throughout the day.  Enjoy exercise with a friend. Support to continue exercising will help you keep a regular routine of activity. What guidelines can I follow while exercising?  Before you start a new exercise program, talk with your health care provider.  Do not exercise so much that you hurt yourself, feel dizzy, or get very short of breath.  Wear comfortable clothes and wear shoes with good support.  Drink plenty of water while you exercise to prevent dehydration or heat stroke.  Work out until your breathing and your heartbeat get faster. Where to find more information  [place] Department of Health and Human Services: [employer]  Centers for Disease Control and Prevention (CDC): http://www.wolf.info/ Summary  Exercising regularly is important. It will improve your overall fitness, flexibility, and endurance.  Regular exercise also will improve your overall health. It can help you control your weight, reduce stress, and improve your bone density.  Do not exercise so much that you hurt yourself, feel dizzy, or get very short of breath.  Before you start a new exercise program, talk with your health care provider. This information is not intended to replace advice given to you by your health care provider. Make sure you discuss any questions you have with your health care provider. Document Revised: 08/09/2017 Document Reviewed: 07/18/2017 Elsevier Patient Education  2020  [employer].

## 2019-11-26 ENCOUNTER — Emergency Department (HOSPITAL_BASED_OUTPATIENT_CLINIC_OR_DEPARTMENT_OTHER)
Admission: EM | Admit: 2019-11-26 | Discharge: 2019-11-26 | Disposition: A | Discharge status: Home / Self Care | Payer: Managed Care, Other (non HMO) | Attending: Emergency Medicine | Admitting: Emergency Medicine

## 2019-11-26 ENCOUNTER — Encounter (HOSPITAL_BASED_OUTPATIENT_CLINIC_OR_DEPARTMENT_OTHER): Payer: Self-pay | Admitting: Emergency Medicine

## 2019-11-26 ENCOUNTER — Other Ambulatory Visit: Payer: Self-pay

## 2019-11-26 ENCOUNTER — Emergency Department (HOSPITAL_BASED_OUTPATIENT_CLINIC_OR_DEPARTMENT_OTHER): Payer: Managed Care, Other (non HMO)

## 2019-11-26 DIAGNOSIS — Z79899 Other long term (current) drug therapy: Secondary | ICD-10-CM | POA: Diagnosis not present

## 2019-11-26 DIAGNOSIS — F17210 Nicotine dependence, cigarettes, uncomplicated: Secondary | ICD-10-CM | POA: Insufficient documentation

## 2019-11-26 DIAGNOSIS — F419 Anxiety disorder, unspecified: Secondary | ICD-10-CM | POA: Insufficient documentation

## 2019-11-26 DIAGNOSIS — R000 Tachycardia, unspecified: Secondary | ICD-10-CM | POA: Insufficient documentation

## 2019-11-26 LAB — CBC WITH DIFFERENTIAL/PLATELET
Abs Immature Granulocytes: 0.01 10*3/uL (ref 0.00–0.07)
Basophils Absolute: 0.1 10*3/uL (ref 0.0–0.1)
Basophils Relative: 1 %
Eosinophils Absolute: 0.3 10*3/uL (ref 0.0–0.5)
Eosinophils Relative: 4 %
HCT: 44.7 % (ref 39.0–52.0)
Hemoglobin: 15.4 g/dL (ref 13.0–17.0)
Immature Granulocytes: 0 %
Lymphocytes Relative: 41 %
Lymphs Abs: 3.2 10*3/uL (ref 0.7–4.0)
MCH: 32 pg (ref 26.0–34.0)
MCHC: 34.5 g/dL (ref 30.0–36.0)
MCV: 92.7 fL (ref 80.0–100.0)
Monocytes Absolute: 0.8 10*3/uL (ref 0.1–1.0)
Monocytes Relative: 11 %
Neutro Abs: 3.4 10*3/uL (ref 1.7–7.7)
Neutrophils Relative %: 43 %
Platelets: 257 10*3/uL (ref 150–400)
RBC: 4.82 MIL/uL (ref 4.22–5.81)
RDW: 12.5 % (ref 11.5–15.5)
WBC: 7.8 10*3/uL (ref 4.0–10.5)
nRBC: 0 % (ref 0.0–0.2)

## 2019-11-26 LAB — RAPID URINE DRUG SCREEN, HOSP PERFORMED
Amphetamines: NOT DETECTED
Barbiturates: NOT DETECTED
Benzodiazepines: NOT DETECTED
Cocaine: NOT DETECTED
Opiates: NOT DETECTED
Tetrahydrocannabinol: NOT DETECTED

## 2019-11-26 LAB — BASIC METABOLIC PANEL WITH GFR
Anion gap: 12 (ref 5–15)
BUN: 11 mg/dL (ref 6–20)
CO2: 23 mmol/L (ref 22–32)
Calcium: 8.6 mg/dL — ABNORMAL LOW (ref 8.9–10.3)
Chloride: 102 mmol/L (ref 98–111)
Creatinine, Ser: 1.11 mg/dL (ref 0.61–1.24)
GFR calc Af Amer: >60 mL/min
GFR calc non Af Amer: >60 mL/min
Glucose, Bld: 131 mg/dL — ABNORMAL HIGH (ref 70–99)
Potassium: 3.5 mmol/L (ref 3.5–5.1)
Sodium: 137 mmol/L (ref 135–145)

## 2019-11-26 LAB — TROPONIN I (HIGH SENSITIVITY)
Troponin I (High Sensitivity): 2 ng/L (ref <18)
Troponin I (High Sensitivity): 4 ng/L (ref <18)

## 2019-11-26 MED ORDER — HYDROXYZINE HCL 25 MG PO TABS: 25.0000 mg | ORAL_TABLET | Freq: Three times a day (TID) | ORAL | 0 refills | Status: AC | PRN

## 2019-11-26 MED ORDER — HALOPERIDOL LACTATE 5 MG/ML IJ SOLN
2.0000 mg | Freq: Once | INTRAMUSCULAR | Status: AC
  Administered 2019-11-26: 04:00:00 2 mg via INTRAVENOUS
  Filled 2019-11-26: qty 1

## 2019-11-26 MED ORDER — ONDANSETRON 4 MG PO TBDP: 2.0000 mg | ORAL_TABLET | Freq: Once | ORAL | Status: DC

## 2019-11-26 MED ORDER — SODIUM CHLORIDE 0.9 % IV BOLUS
500.0000 mL | Freq: Once | INTRAVENOUS | Status: AC
  Administered 2019-11-26: 03:00:00 500 mL via INTRAVENOUS

## 2019-11-26 MED ORDER — DIPHENHYDRAMINE HCL 25 MG PO CAPS
25.0000 mg | ORAL_CAPSULE | Freq: Once | ORAL | Status: AC
  Administered 2019-11-26: 04:00:00 25 mg via ORAL
  Filled 2019-11-26: qty 1

## 2019-11-26 MED ORDER — IOHEXOL 350 MG/ML SOLN
100.0000 mL | Freq: Once | INTRAVENOUS | Status: AC
  Administered 2019-11-26: 03:00:00 100 mL via INTRAVENOUS

## 2019-11-26 MED ORDER — SODIUM CHLORIDE 0.9 % IV BOLUS
500.0000 mL | Freq: Once | INTRAVENOUS | Status: AC
  Administered 2019-11-26: 500 mL via INTRAVENOUS

## 2019-11-26 NOTE — ED Triage Notes (Signed)
Patient presents with complaints of waking up this am with a headache; states he bent over to take some ibuprofen and states felt onset of heart racing; ambulatory to room without difficulty.
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
## 2019-12-31 ENCOUNTER — Other Ambulatory Visit: Payer: Self-pay

## 2019-12-31 ENCOUNTER — Encounter: Payer: Self-pay | Admitting: Family Medicine

## 2019-12-31 ENCOUNTER — Ambulatory Visit (INDEPENDENT_AMBULATORY_CARE_PROVIDER_SITE_OTHER): Payer: PRIVATE HEALTH INSURANCE | Admitting: Family Medicine

## 2019-12-31 VITALS — BP 116/74 | HR 100 | Temp 96.6°F | Ht 68.0 in | Wt 176.6 lb

## 2019-12-31 DIAGNOSIS — F418 Other specified anxiety disorders: Secondary | ICD-10-CM

## 2019-12-31 DIAGNOSIS — E7800 Pure hypercholesterolemia, unspecified: Secondary | ICD-10-CM

## 2019-12-31 DIAGNOSIS — R000 Tachycardia, unspecified: Secondary | ICD-10-CM | POA: Diagnosis not present

## 2019-12-31 MED ORDER — PAROXETINE HCL 10 MG PO TABS: ORAL_TABLET | ORAL | 1 refills | Status: DC
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
## 2019-12-31 NOTE — Patient Instructions (Signed)
Preventing High Cholesterol Cholesterol is a white, waxy substance similar to fat that the human body needs to help build cells. The liver makes all the cholesterol that a person's body needs. Having high cholesterol (hypercholesterolemia) increases a person's risk for heart disease and stroke. Extra (excess) cholesterol comes from the food the person eats. High cholesterol can often be prevented with diet and lifestyle changes. If you already have high cholesterol, you can control it with diet and lifestyle changes and with medicine. How can high cholesterol affect me? If you have high cholesterol, deposits (plaques) may build up on the walls of your arteries. The arteries are the blood vessels that carry blood away from your heart. Plaques make the arteries narrower and stiffer. This can limit or block blood flow and cause blood clots to form. Blood clots:  Are tiny balls of cells that form in your blood.  Can move to the heart or brain, causing a heart attack or stroke. Plaques in arteries greatly increase your risk for heart attack and stroke.Making diet and lifestyle changes can reduce your risk for these conditions that may threaten your life. What can increase my risk? This condition is more likely to develop in people who:  Eat foods that are high in saturated fat or cholesterol. Saturated fat is mostly found in: ? Foods that contain animal fat, such as red meat and some dairy products. ? Certain fatty foods made from plants, such as tropical oils.  Are overweight.  Are not getting enough exercise.  Have a family history of high cholesterol. What actions can I take to prevent this? Nutrition   Eat less saturated fat.  Avoid trans fats (partially hydrogenated oils). These are often found in margarine and in some baked goods, fried foods, and snacks bought in packages.  Avoid precooked or cured meat, such as sausages or meat loaves.  Avoid foods and drinks that have added  sugars.  Eat more fruits, vegetables, and whole grains.  Choose healthy sources of protein, such as fish, poultry, lean cuts of red meat, beans, peas, lentils, and nuts.  Choose healthy sources of fat, such as: ? Nuts. ? Vegetable oils, especially olive oil. ? Fish that have healthy fats (omega-3 fatty acids), such as mackerel or salmon. The items listed above may not be a complete list of recommended foods and beverages. Contact a dietitian for more information. Lifestyle  Lose weight if you are overweight. Losing 5-10 lb (2.3-4.5 kg) can help prevent or control high cholesterol. It can also lower your risk for diabetes and high blood pressure. Ask your health care provider to help you with a diet and exercise plan to lose weight safely.  Do not use any products that contain nicotine or tobacco, such as cigarettes, e-cigarettes, and chewing tobacco. If you need help quitting, ask your health care provider.  Limit your alcohol intake. ? Do not drink alcohol if:  Your health care provider tells you not to drink.  You are pregnant, may be pregnant, or are planning to become pregnant. ? If you drink alcohol:  Limit how much you use to:  0-1 drink a day for women.  0-2 drinks a day for men.  Be aware of how much alcohol is in your drink. In the [place] one drink equals one 12 oz bottle of beer (355 mL), one 5 oz glass of wine (148 mL), or one 1 oz glass of hard liquor (44 mL). Activity   Get enough exercise. Each week, do at   least 150 minutes of exercise that takes a medium level of effort (moderate-intensity exercise). ? This is exercise that:  Makes your heart beat faster and makes you breathe harder than usual.  Allows you to still be able to talk. ? You could exercise in short sessions several times a day or longer sessions a few times a week. For example, on 5 days each week, you could walk fast or ride your bike 3 times a day for 10 minutes each time.  Do exercises as told  by your health care provider. Medicines  In addition to diet and lifestyle changes, your health care provider may recommend medicines to help lower cholesterol. This may be a medicine to lower the amount of cholesterol your liver makes. You may need medicine if: ? Diet and lifestyle changes do not lower your cholesterol enough. ? You have high cholesterol and other risk factors for heart disease or stroke.  Take over-the-counter and prescription medicines only as told by your health care provider. General information  Manage your risk factors for high cholesterol. Talk with your health care provider about all your risk factors and how to lower your risk.  Manage other conditions that you have, such as diabetes or high blood pressure (hypertension).  Have blood tests to check your cholesterol levels at regular points in time as told by your health care provider.  Keep all follow-up visits as told by your health care provider. This is important. Where to find more information  American Heart Association: www.heart.org  National Heart, Lung, and Blood Institute: PopSteam.is Summary  High cholesterol increases your risk for heart disease and stroke. By keeping your cholesterol level low, you can reduce your risk for these conditions.  High cholesterol can often be prevented with diet and lifestyle changes.  Work with your health care provider to manage your risk factors, and have your blood tested regularly. This information is not intended to replace advice given to you by your health care provider. Make sure you discuss any questions you have with your health care provider. Document Revised: 12/19/2018 Document Reviewed: 05/05/2016 Elsevier Patient Education  2020 Else[employer] Inc.  Managing Anxiety, Adult After being diagnosed with an anxiety disorder, you may be relieved to know why you have felt or behaved a certain way. You may also feel overwhelmed about the treatment ahead  and what it will mean for your life. With care and support, you can manage this condition and recover from it. How to manage lifestyle changes Managing stress and anxiety  Stress is your body's reaction to life changes and events, both good and bad. Most stress will last just a few hours, but stress can be ongoing and can lead to more than just stress. Although stress can play a major role in anxiety, it is not the same as anxiety. Stress is usually caused by something external, such as a deadline, test, or competition. Stress normally passes after the triggering event has ended.  Anxiety is caused by something internal, such as imagining a terrible outcome or worrying that something will go wrong that will devastate you. Anxiety often does not go away even after the triggering event is over, and it can become long-term (chronic) worry. It is important to understand the differences between stress and anxiety and to manage your stress effectively so that it does not lead to an anxious response. Talk with your health care provider or a counselor to learn more about reducing anxiety and stress. He or she may  suggest tension reduction techniques, such as:  Music therapy. This can include creating or listening to music that you enjoy and that inspires you.  Mindfulness-based meditation. This involves being aware of your normal breaths while not trying to control your breathing. It can be done while sitting or walking.  Centering prayer. This involves focusing on a word, phrase, or sacred image that means something to you and brings you peace.  Deep breathing. To do this, expand your stomach and inhale slowly through your nose. Hold your breath for 3-5 seconds. Then exhale slowly, letting your stomach muscles relax.  Self-talk. This involves identifying thought patterns that lead to anxiety reactions and changing those patterns.  Muscle relaxation. This involves tensing muscles and then relaxing  them. Choose a tension reduction technique that suits your lifestyle and personality. These techniques take time and practice. Set aside 5-15 minutes a day to do them. Therapists can offer counseling and training in these techniques. The training to help with anxiety may be covered by some insurance plans. Other things you can do to manage stress and anxiety include:  Keeping a stress/anxiety diary. This can help you learn what triggers your reaction and then learn ways to manage your response.  Thinking about how you react to certain situations. You may not be able to control everything, but you can control your response.  Making time for activities that help you relax and not feeling guilty about spending your time in this way.  Visual imagery and yoga can help you stay calm and relax.  Medicines Medicines can help ease symptoms. Medicines for anxiety include:  Anti-anxiety drugs.  Antidepressants. Medicines are often used as a primary treatment for anxiety disorder. Medicines will be prescribed by a health care provider. When used together, medicines, psychotherapy, and tension reduction techniques may be the most effective treatment. Relationships Relationships can play a big part in helping you recover. Try to spend more time connecting with trusted friends and family members. Consider going to couples counseling, taking family education classes, or going to family therapy. Therapy can help you and others better understand your condition. How to recognize changes in your anxiety Everyone responds differently to treatment for anxiety. Recovery from anxiety happens when symptoms decrease and stop interfering with your daily activities at home or work. This may mean that you will start to:  Have better concentration and focus. Worry will interfere less in your daily thinking.  Sleep better.  Be less irritable.  Have more energy.  Have improved memory. It is important to recognize  when your condition is getting worse. Contact your health care provider if your symptoms interfere with home or work and you feel like your condition is not improving. Follow these instructions at home: Activity  Exercise. Most adults should do the following: ? Exercise for at least 150 minutes each week. The exercise should increase your heart rate and make you sweat (moderate-intensity exercise). ? Strengthening exercises at least twice a week.  Get the right amount and quality of sleep. Most adults need 7-9 hours of sleep each night. Lifestyle   Eat a healthy diet that includes plenty of vegetables, fruits, whole grains, low-fat dairy products, and lean protein. Do not eat a lot of foods that are high in solid fats, added sugars, or salt.  Make choices that simplify your life.  Do not use any products that contain nicotine or tobacco, such as cigarettes, e-cigarettes, and chewing tobacco. If you need help quitting, ask your health care  provider.  Avoid caffeine, alcohol, and certain over-the-counter cold medicines. These may make you feel worse. Ask your pharmacist which medicines to avoid. General instructions  Take over-the-counter and prescription medicines only as told by your health care provider.  Keep all follow-up visits as told by your health care provider. This is important. Where to find support You can get help and support from these sources:  Self-help groups.  Online and [employer].  A trusted spiritual leader.  Couples counseling.  Family education classes.  Family therapy. Where to find more information You may find that joining a support group helps you deal with your anxiety. The following sources can help you locate counselors or support groups near you:  [place]: www.mentalhealth[place].net  Anxiety and Depression Association of [place] (ADAA): https://www.clark.net/  National Alliance on Mental Illness (NAMI): www.nami.org Contact  a health care provider if you:  Have a hard time staying focused or finishing daily tasks.  Spend many hours a day feeling worried about everyday life.  Become exhausted by worry.  Start to have headaches, feel tense, or have nausea.  Urinate more than normal.  Have diarrhea. Get help right away if you have:  A racing heart and shortness of breath.  Thoughts of hurting yourself or others. If you ever feel like you may hurt yourself or others, or have thoughts about taking your own life, get help right away. You can go to your nearest emergency department or call:  Your local emergency services (911 in the [place]  A suicide crisis helpline, such as the [facility] at 309-017-7351. This is open 24 hours a day. Summary  Taking steps to learn and use tension reduction techniques can help calm you and help prevent triggering an anxiety reaction.  When used together, medicines, psychotherapy, and tension reduction techniques may be the most effective treatment.  Family, friends, and partners can play a big part in helping you recover from an anxiety disorder. This information is not intended to replace advice given to you by your health care provider. Make sure you discuss any questions you have with your health care provider. Document Revised: 01/27/2019 Document Reviewed: 01/27/2019 Elsevier Patient Education  [employer].

## 2020-01-11 ENCOUNTER — Encounter: Payer: Self-pay | Admitting: General Practice

## 2020-02-04 ENCOUNTER — Ambulatory Visit: Payer: PRIVATE HEALTH INSURANCE | Admitting: Family Medicine

## 2020-02-26 ENCOUNTER — Other Ambulatory Visit: Payer: Self-pay | Admitting: Family Medicine

## 2020-02-26 DIAGNOSIS — F418 Other specified anxiety disorders: Secondary | ICD-10-CM

## 2020-03-14 ENCOUNTER — Ambulatory Visit: Payer: PRIVATE HEALTH INSURANCE | Admitting: Family Medicine

## 2020-03-17 ENCOUNTER — Ambulatory Visit: Payer: PRIVATE HEALTH INSURANCE | Admitting: Family Medicine

## 2020-03-22 ENCOUNTER — Other Ambulatory Visit: Payer: Self-pay | Admitting: Family Medicine

## 2020-03-22 DIAGNOSIS — F418 Other specified anxiety disorders: Secondary | ICD-10-CM

## 2020-04-08 ENCOUNTER — Telehealth: Payer: Self-pay | Admitting: Family Medicine

## 2020-04-08 ENCOUNTER — Encounter: Payer: Self-pay | Admitting: Family Medicine

## 2020-04-08 NOTE — Telephone Encounter (Signed)
Pt was no show for appt 03/17/20. First occurrence. Waived Fee. Letter mailed.  PCP,  Please reply back with corresponding letter matching appropriate follow up needs.  A - No follow up necessary B - Follow up urgent - locate patient immediately to schedule appointment. C - Follow up necessary. Contact patient and schedule visit w/in 7 days. D - Follow up necessary. Contact patient and schedule visit w/in 2-4 weeks.  E - Follow up necessary. Contact patient and schedule visit w/in 3 months.

## 2020-04-20 ENCOUNTER — Other Ambulatory Visit: Payer: Self-pay | Admitting: Family Medicine

## 2020-04-20 DIAGNOSIS — F418 Other specified anxiety disorders: Secondary | ICD-10-CM

## 2020-05-20 ENCOUNTER — Other Ambulatory Visit: Payer: Self-pay | Admitting: Family Medicine

## 2020-05-20 DIAGNOSIS — F418 Other specified anxiety disorders: Secondary | ICD-10-CM

## 2020-05-30 ENCOUNTER — Telehealth: Payer: Self-pay | Admitting: General Practice

## 2020-05-30 NOTE — Telephone Encounter (Signed)
Left voicemail an sent a my chart message informing pt to call office and make an appointment.

## 2020-05-30 NOTE — Telephone Encounter (Signed)
Patient called and stated that he has transferred to [facility] and doesn't wish to be contacted.

## 2020-05-31 NOTE — Telephone Encounter (Signed)
Called patient to schedule appointment for follow up, no answer LMTCB and schedule follow up appt.  

## 2020-06-21 ENCOUNTER — Other Ambulatory Visit: Payer: Self-pay | Admitting: Family Medicine

## 2020-06-21 DIAGNOSIS — F418 Other specified anxiety disorders: Secondary | ICD-10-CM

## 2020-06-21 NOTE — Telephone Encounter (Signed)
Called patient to schedule appointment, no answer LMTCB to schedule.

## 2020-07-25 ENCOUNTER — Other Ambulatory Visit: Payer: Self-pay | Admitting: Family Medicine

## 2020-07-25 DIAGNOSIS — F418 Other specified anxiety disorders: Secondary | ICD-10-CM

## 2020-07-25 NOTE — Telephone Encounter (Signed)
Please see message and advise.  Thank you. Last OV 12/31/19 Last fill 06/21/20  #60/0

## 2020-08-25 ENCOUNTER — Other Ambulatory Visit: Payer: Self-pay | Admitting: Family

## 2020-08-25 DIAGNOSIS — F418 Other specified anxiety disorders: Secondary | ICD-10-CM

## 2020-09-16 ENCOUNTER — Other Ambulatory Visit: Payer: Self-pay | Admitting: Family

## 2020-09-16 DIAGNOSIS — F418 Other specified anxiety disorders: Secondary | ICD-10-CM

## 2020-10-20 ENCOUNTER — Other Ambulatory Visit: Payer: Self-pay | Admitting: Family

## 2020-10-20 DIAGNOSIS — F418 Other specified anxiety disorders: Secondary | ICD-10-CM

## 2020-11-13 ENCOUNTER — Other Ambulatory Visit: Payer: Self-pay | Admitting: Family

## 2020-11-13 DIAGNOSIS — F418 Other specified anxiety disorders: Secondary | ICD-10-CM

## 2020-11-22 ENCOUNTER — Encounter: Payer: Self-pay | Admitting: Family Medicine

## 2020-11-23 NOTE — Telephone Encounter (Signed)
Pt calling back regarding a refill for Paxil.  He uses [facility] on [address].  Thanks

## 2020-12-17 ENCOUNTER — Other Ambulatory Visit: Payer: Self-pay | Admitting: Family

## 2020-12-17 DIAGNOSIS — F418 Other specified anxiety disorders: Secondary | ICD-10-CM

## 2022-02-25 IMAGING — CT CT ANGIO CHEST
2 of 8 series · 19 of 36 positions shown · IV contrast (Omnipaque)
Comparison: Chest radiograph dated 11/26/2019.

CLINICAL DATA: 53-year-old male with palpitation.

EXAM:
CT ANGIOGRAPHY CHEST WITH CONTRAST
TECHNIQUE: Multidetector CT imaging of the chest was performed using the
standard protocol during bolus administration of intravenous
contrast. Multiplanar CT image reconstructions and MIPs were
obtained to evaluate the vascular anatomy.
CONTRAST:  100mL OMNIPAQUE IOHEXOL 350 MG/ML SOLN

[Series 6: pe coronal mpr · coronal · 0.64mm/px · 1 of 151 slices shown]
[im 76/151  mediastinal]
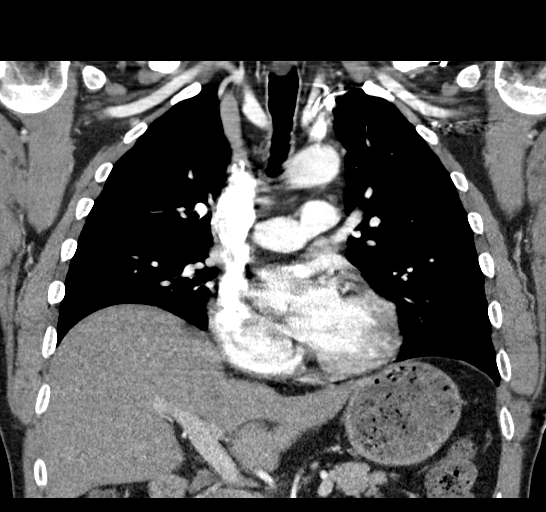

[Series 10: pe thins · axial · 0.74mm/px · z∈[-241,+47]mm · 18 of 322 slices shown]
[im 17/322  lung]
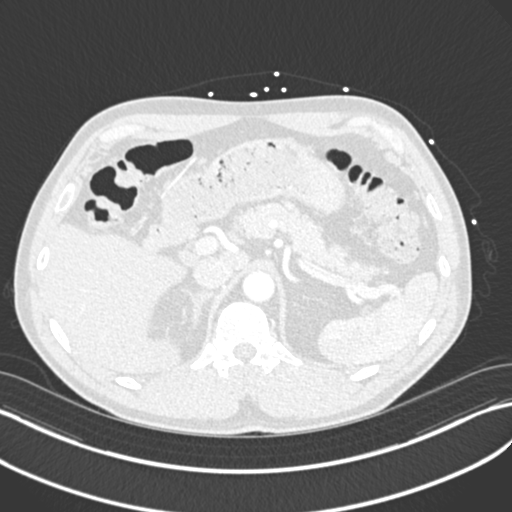
[im 34/322  mediastinal]
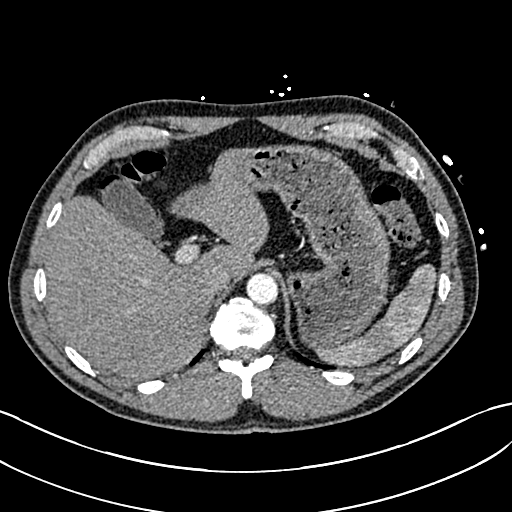
[im 51/322  lung]
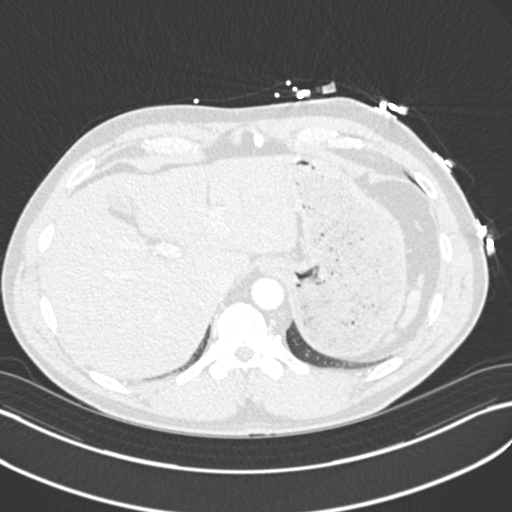
[im 68/322  mediastinal]
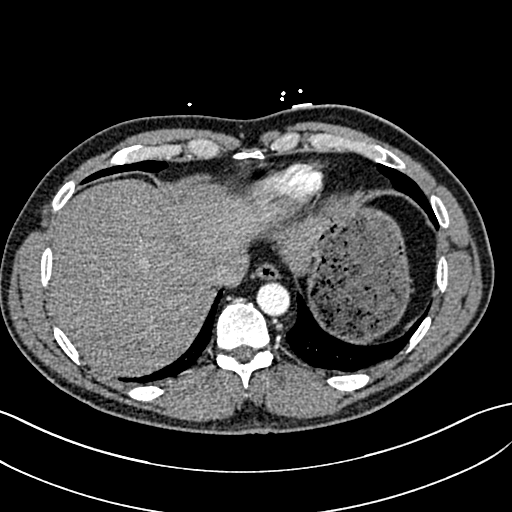
[im 85/322  lung]
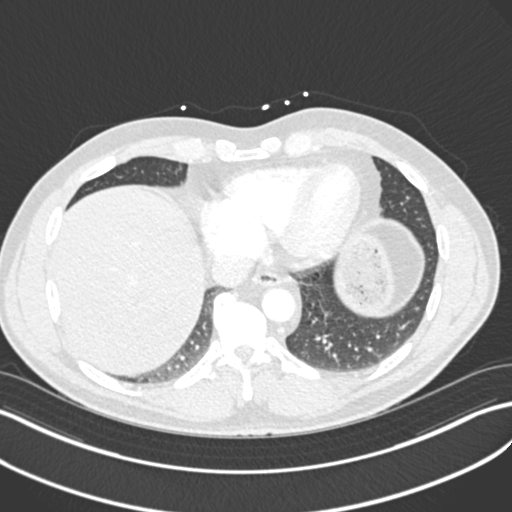
[im 102/322  mediastinal]
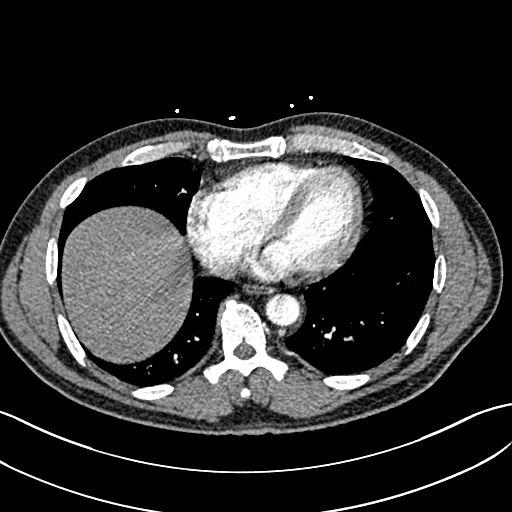
[im 119/322  lung]
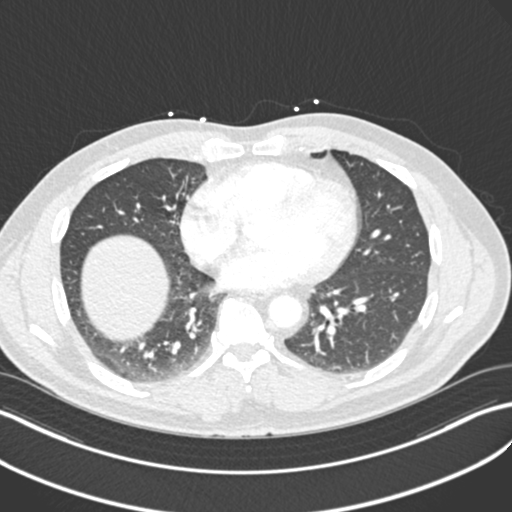
[im 136/322  mediastinal]
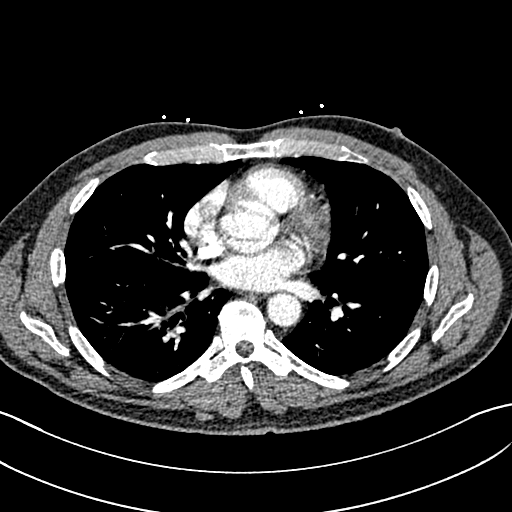
[im 153/322  lung]
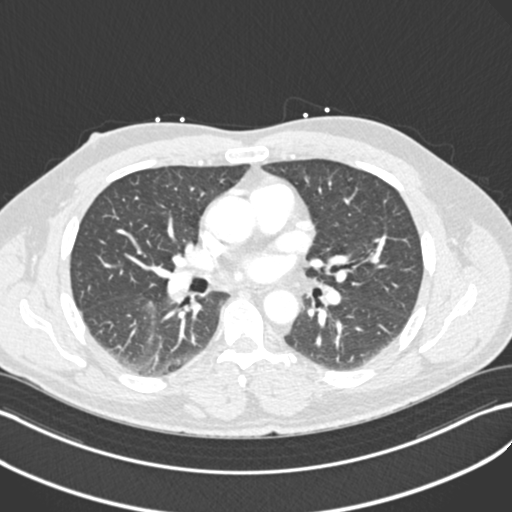
[im 169/322  mediastinal]
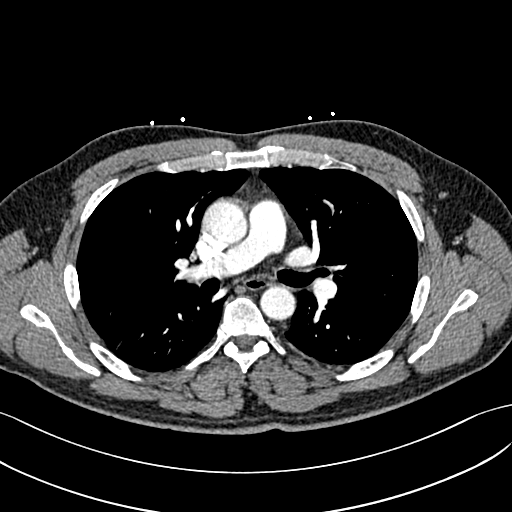
[im 186/322  lung]
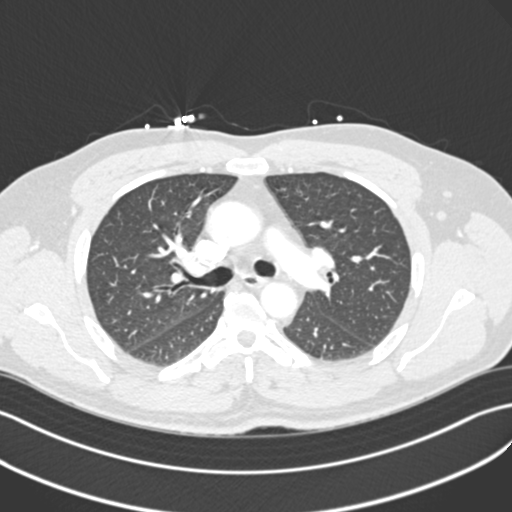
[im 203/322  mediastinal]
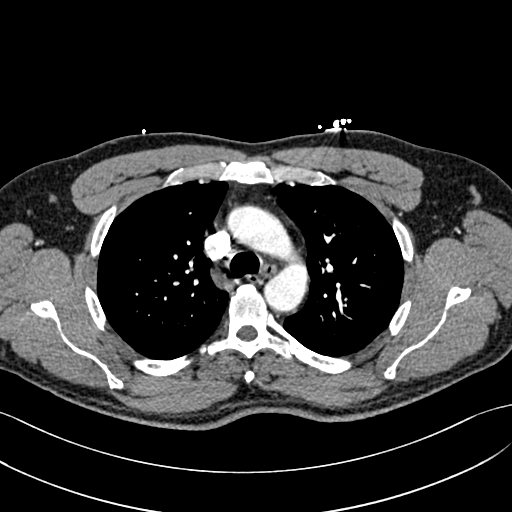
[im 220/322  lung]
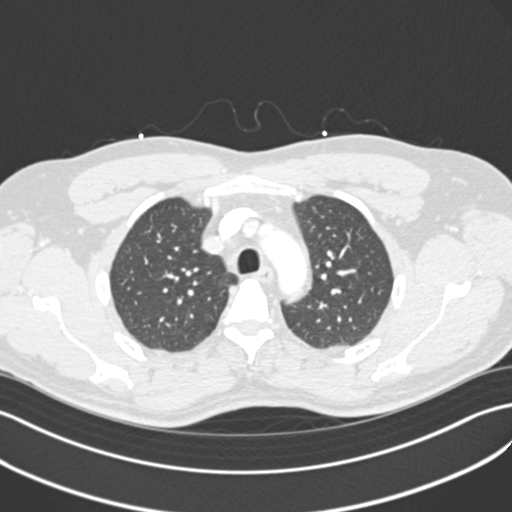
[im 237/322  mediastinal]
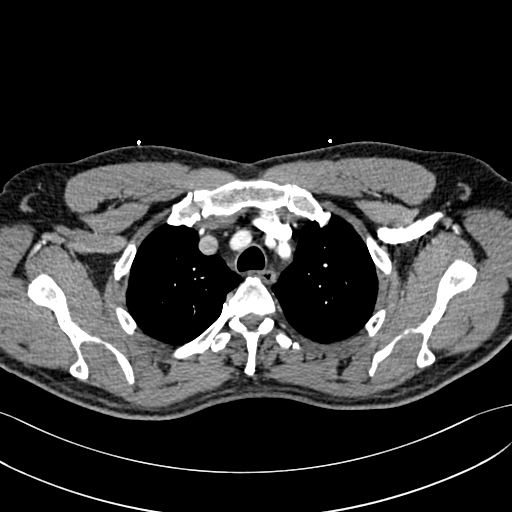
[im 254/322  lung]
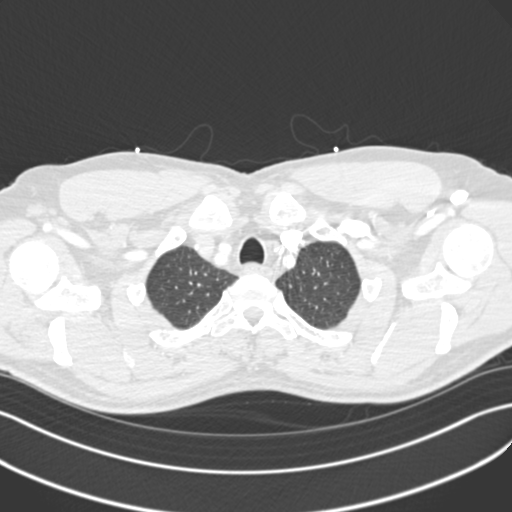
[im 271/322  mediastinal]
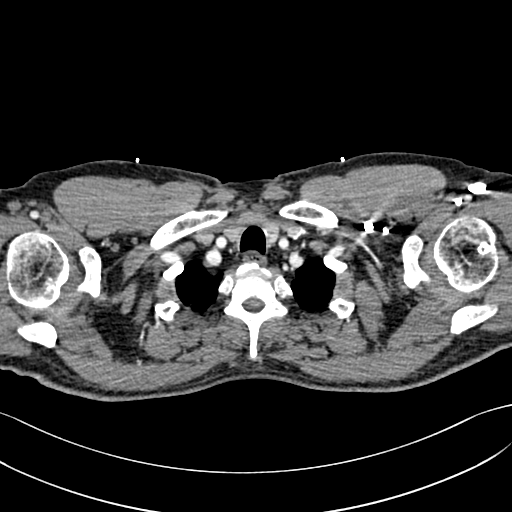
[im 288/322  lung]
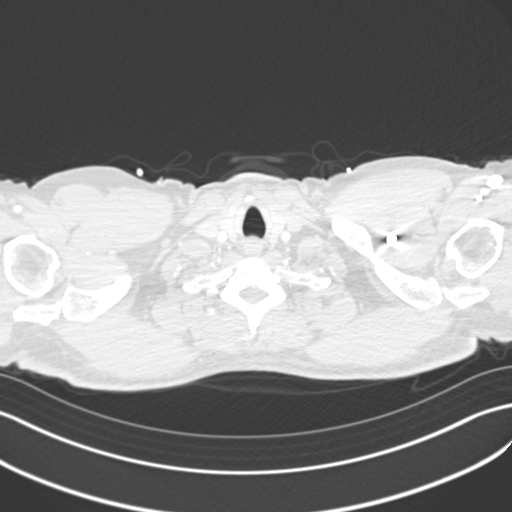
[im 305/322  mediastinal]
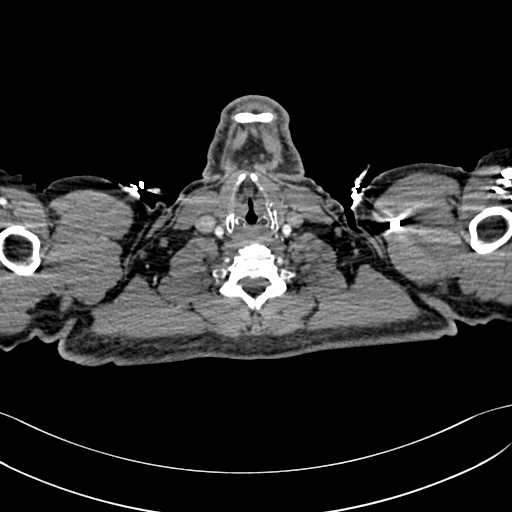

[19 of 36 positions shown; findings below may reference images not displayed]

FINDINGS: Cardiovascular: There is no cardiomegaly or pericardial effusion.
The thoracic aorta is unremarkable. The origins of the great vessels
of the aortic arch appear patent. No pulmonary artery embolus
identified.

Mediastinum/Nodes: There is no hilar or mediastinal adenopathy. The
esophagus and the thyroid gland are grossly unremarkable. No
mediastinal fluid collection.

Lungs/Pleura: The lungs are clear. There is no pleural effusion or
pneumothorax. The central airways are patent.

Upper Abdomen: Fatty infiltration of the liver. The visualized upper
abdomen is otherwise unremarkable.

Musculoskeletal: No chest wall abnormality. No acute or significant
osseous findings.

Review of the MIP images confirms the above findings.
IMPRESSION: 1. No acute intrathoracic pathology. No CT evidence of pulmonary
artery embolus.
2. Fatty liver.
